# Patient Record
Sex: Male | Born: 2005 | Race: Black or African American | Hispanic: No | Marital: Single | Smoking: Never smoker
Health system: Southern US, Community
[De-identification: ages and names within clinical notes are randomized; demographics above are authoritative.]

## PROBLEM LIST (undated history)

## (undated) DIAGNOSIS — S62109A Fracture of unspecified carpal bone, unspecified wrist, initial encounter for closed fracture: Secondary | ICD-10-CM

## (undated) DIAGNOSIS — S82899A Other fracture of unspecified lower leg, initial encounter for closed fracture: Secondary | ICD-10-CM

---
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
## 2024-06-10 ENCOUNTER — Other Ambulatory Visit: Payer: Self-pay

## 2024-06-10 ENCOUNTER — Encounter (HOSPITAL_BASED_OUTPATIENT_CLINIC_OR_DEPARTMENT_OTHER): Payer: Self-pay | Admitting: Orthopaedic Surgery
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
## 2024-06-17 ENCOUNTER — Ambulatory Visit (HOSPITAL_BASED_OUTPATIENT_CLINIC_OR_DEPARTMENT_OTHER): Admission: RE | Admit: 2024-06-17 | Payer: Self-pay | Source: Home / Self Care | Admitting: Orthopaedic Surgery

## 2024-06-17 ENCOUNTER — Encounter (HOSPITAL_BASED_OUTPATIENT_CLINIC_OR_DEPARTMENT_OTHER): Admission: RE | Payer: Self-pay | Source: Home / Self Care

## 2024-06-17 SURGERY — ARTHROSCOPY, KNEE, WITH ANTERIOR CRUCIATE LIGAMENT (ACL) RECONSTRUCTION
Anesthesia: Choice | Site: Knee | Laterality: Right

## 2024-06-21 ENCOUNTER — Ambulatory Visit: Payer: Self-pay | Attending: Physical Therapy | Admitting: Physical Therapy
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
